# Patient Record
Sex: Male | Born: 1960
Health system: Southern US, Community
[De-identification: ages and names within clinical notes are randomized; demographics above are authoritative.]

## PROBLEM LIST (undated history)

## (undated) DIAGNOSIS — K2270 Barrett's esophagus without dysplasia: Secondary | ICD-10-CM

## (undated) DIAGNOSIS — E785 Hyperlipidemia, unspecified: Secondary | ICD-10-CM

## (undated) DIAGNOSIS — K219 Gastro-esophageal reflux disease without esophagitis: Secondary | ICD-10-CM

## (undated) HISTORY — DX: Gastro-esophageal reflux disease without esophagitis: K21.9

## (undated) HISTORY — PX: ESOPHAGOGASTRODUODENOSCOPY: SHX1529

## (undated) HISTORY — DX: Hyperlipidemia, unspecified: E78.5

## (undated) HISTORY — DX: Barrett's esophagus without dysplasia: K22.70

---

## 2015-03-30 ENCOUNTER — Encounter: Payer: Self-pay | Admitting: Gastroenterology

## 2018-02-12 DIAGNOSIS — J302 Other seasonal allergic rhinitis: Secondary | ICD-10-CM | POA: Diagnosis not present

## 2018-02-12 DIAGNOSIS — K2270 Barrett's esophagus without dysplasia: Secondary | ICD-10-CM | POA: Diagnosis not present

## 2018-02-12 DIAGNOSIS — E7800 Pure hypercholesterolemia, unspecified: Secondary | ICD-10-CM | POA: Diagnosis not present

## 2018-02-12 DIAGNOSIS — K219 Gastro-esophageal reflux disease without esophagitis: Secondary | ICD-10-CM | POA: Diagnosis not present

## 2018-02-12 DIAGNOSIS — Z23 Encounter for immunization: Secondary | ICD-10-CM | POA: Diagnosis not present

## 2018-02-12 DIAGNOSIS — Z125 Encounter for screening for malignant neoplasm of prostate: Secondary | ICD-10-CM | POA: Diagnosis not present

## 2018-02-12 DIAGNOSIS — R82998 Other abnormal findings in urine: Secondary | ICD-10-CM | POA: Diagnosis not present

## 2018-02-12 DIAGNOSIS — Z0000 Encounter for general adult medical examination without abnormal findings: Secondary | ICD-10-CM | POA: Diagnosis not present

## 2018-02-17 DIAGNOSIS — H2513 Age-related nuclear cataract, bilateral: Secondary | ICD-10-CM | POA: Diagnosis not present

## 2018-02-17 DIAGNOSIS — H33053 Total retinal detachment, bilateral: Secondary | ICD-10-CM | POA: Diagnosis not present

## 2018-02-17 DIAGNOSIS — H10413 Chronic giant papillary conjunctivitis, bilateral: Secondary | ICD-10-CM | POA: Diagnosis not present

## 2018-02-17 DIAGNOSIS — H35413 Lattice degeneration of retina, bilateral: Secondary | ICD-10-CM | POA: Diagnosis not present

## 2018-03-11 DIAGNOSIS — J392 Other diseases of pharynx: Secondary | ICD-10-CM | POA: Diagnosis not present

## 2018-03-11 DIAGNOSIS — K219 Gastro-esophageal reflux disease without esophagitis: Secondary | ICD-10-CM | POA: Diagnosis not present

## 2018-03-11 DIAGNOSIS — J301 Allergic rhinitis due to pollen: Secondary | ICD-10-CM | POA: Diagnosis not present

## 2018-03-18 ENCOUNTER — Encounter: Payer: Self-pay | Admitting: Gastroenterology

## 2018-04-08 DIAGNOSIS — J309 Allergic rhinitis, unspecified: Secondary | ICD-10-CM | POA: Insufficient documentation

## 2018-04-08 DIAGNOSIS — R062 Wheezing: Secondary | ICD-10-CM | POA: Diagnosis not present

## 2018-04-15 ENCOUNTER — Encounter (INDEPENDENT_AMBULATORY_CARE_PROVIDER_SITE_OTHER): Payer: Self-pay

## 2018-04-15 ENCOUNTER — Ambulatory Visit (INDEPENDENT_AMBULATORY_CARE_PROVIDER_SITE_OTHER): Payer: BLUE CROSS/BLUE SHIELD | Admitting: Gastroenterology

## 2018-04-15 ENCOUNTER — Encounter: Payer: Self-pay | Admitting: Gastroenterology

## 2018-04-15 VITALS — BP 128/84 | HR 72 | Ht 61.0 in | Wt 152.2 lb

## 2018-04-15 DIAGNOSIS — K219 Gastro-esophageal reflux disease without esophagitis: Secondary | ICD-10-CM

## 2018-04-15 DIAGNOSIS — K2270 Barrett's esophagus without dysplasia: Secondary | ICD-10-CM

## 2018-04-15 NOTE — Progress Notes (Addendum)
[facility] Consult Note:  History: Keith Pacheco 04/15/2018  Referring physician: Misty Stanley, MD [facility])  Reason for consult/chief complaint: Gastroesophageal Reflux (hx for years. c/o a minor burning sensation and dry throat. Worse at night. Denies dysphagia, abdominal pain. He does have some regurgitation.) And reported history of Barrett's esophagus  Subjective  HPI:   Keith Pacheco is a very pleasant man referred by a local ENT physician, who evaluated him on 03/11/2018 for chronic sinus congestion and throat irritation.  He had chronic allergic symptoms and also reported taking daily Nexium for a history of Barrett's esophagus. Rx with nasal sprays, referred to allergist.  The allergic symptoms began last year after he moved here from [place], and then got worse over the summer.  They have improved with the treatments, but he still has some dry scratchy throat, especially in the evening. He previously had intermittent heartburn or regurgitation, more so if he has offending foods like wine, chocolate or coffee.  Symptoms under good control with once daily Nexium, which he recently redosed to before meal on the recommendation of ENT. He denies dysphagia or odynophagia. Jhovanny brought records from his previous endoscopic testing in [place], good and color photographs.  No biopsy reports are available, so we have sent a record release request for those.  Upper endoscopy dated 03/30/2015 by Dr. Coralyn Helling reports irregular Z line at 40 cm, biopsies taken, remainder of exam normal except for reported laryngeal edema.  One color photos seems to show a small squamous island just above a mildly irregular Z line.  It is unknown if this is the exact spot where the biopsy was taken.  Repeat upper endoscopy November 2017 reportedly for Barrett's esophagus report a 1 cm length of changes consistent with Barrett's.Jaymon calls being told that no  Barrett's was found on those biopsies. Colonoscopy 03/21/2016 to the terminal ileum with good preparation was normal.  Repeat exam in 10 years was advised.  ROS:  Review of Systems  Constitutional: Negative for appetite change and unexpected weight change.  HENT: Negative for mouth sores and voice change.   Eyes: Negative for pain and redness.  Respiratory: Negative for cough and shortness of breath.   Cardiovascular: Negative for chest pain and palpitations.  Genitourinary: Negative for dysuria and hematuria.  Musculoskeletal: Negative for arthralgias and myalgias.  Skin: Negative for pallor and rash.  Neurological: Negative for weakness and headaches.  Hematological: Negative for adenopathy.     Past Medical History: Past Medical History:  Diagnosis Date  . Barrett esophagus   . GERD (gastroesophageal reflux disease)   . Hyperlipidemia      Past Surgical History: Past Surgical History:  Procedure Laterality Date  . ESOPHAGOGASTRODUODENOSCOPY       Family History: Family History  Problem Relation Age of Onset  . Esophageal cancer Mother   His mother died at age 84 from esophageal cancer (unknown type)  Social History: Social History   Socioeconomic History  . Marital status: Married    Spouse name: Not on file  . Number of children: 2  . Years of education: Not on file  . Highest education level: Not on file  Occupational History  . Occupation: CFO    Employer: [employer]  Social Needs  . Financial resource strain: Not on file  . Food insecurity:    Worry: Not on file    Inability: Not on file  . Transportation needs:    Medical: Not on  file    Non-medical: Not on file  Tobacco Use  . Smoking status: Never Smoker  . Smokeless tobacco: Never Used  Substance and Sexual Activity  . Alcohol use: Yes  . Drug use: Never  . Sexual activity: Not on file  Lifestyle  . Physical activity:    Days per week: Not on file    Minutes per session: Not on  file  . Stress: Not on file  Relationships  . Social connections:    Talks on phone: Not on file    Gets together: Not on file    Attends religious service: Not on file    Active member of club or organization: Not on file    Attends meetings of clubs or organizations: Not on file    Relationship status: Not on file  Other Topics Concern  . Not on file  Social History Narrative  . Not on file    Allergies: No Known Allergies  Outpatient Meds: Current Outpatient Medications  Medication Sig Dispense Refill  . aspirin 81 MG chewable tablet Chew by mouth.    Marland Kitchen. azelastine (ASTELIN) 0.1 % nasal spray Place into the nose.    . esomeprazole (NEXIUM) 40 MG capsule Take by mouth.    . fluticasone (FLONASE) 50 MCG/ACT nasal spray Place into the nose.    . simvastatin (ZOCOR) 20 MG tablet Take by mouth.     No current facility-administered medications for this visit.       ___________________________________________________________________ Objective   Exam:  BP 128/84   Pulse 72   Ht 5\' 1"  (1.549 m)   Wt 152 lb 4 oz (69.1 kg)   BMI 28.77 kg/m    General: this is a(n) well-appearing man with normal vocal quality  Eyes: sclera anicteric, no redness  ENT: oral mucosa moist without lesions, no cervical or supraclavicular lymphadenopathy  CV: RRR without murmur, S1/S2, no JVD, no peripheral edema  Resp: clear to auscultation bilaterally, normal RR and effort noted  GI: soft, no tenderness, with active bowel sounds. No guarding or palpable organomegaly noted.  Skin; warm and dry, no rash or jaundice noted  Neuro: awake, alert and oriented x 3. Normal gross motor function and fluent speech  Labs:  Records as noted above  Assessment: Encounter Diagnoses  Name Primary?  . Gastroesophageal reflux disease without esophagitis Yes  . Barrett's esophagus without dysplasia     Well-controlled reflux symptoms Family history of esophageal cancer. Personal history of  Barrett's esophagus (unknown if dysplasia), but apparently was not present on follow-up endoscopy a year later.  We discussed the current guidelines for Barrett's surveillance, and how it would usually be a 3-year upper endoscopy interval if no dysplasia is found.  Also, his family history of esophageal cancer was not known at that time.  Plan:  Obtain prior biopsy records by then determine appropriate interval for follow-up EGD.  I have told him how there are no clear guidelines on the optimal interval in this situation when Barrett's was not found on the follow-up exam.  However, given that it was reportedly seen in 2016 and he has a family history of esophageal cancer, it seems reasonable to look again at some point. Further advice to follow record review.  His GERD otherwise seems under good control, and his throat discomfort appears to be allergic /environmental.  Thank you for the courtesy of this consult.  Please call me with any questions or concerns.  Charlie PitterHenry L Danis III  CC: Tisovec, Richard  Lacretia Nicks, MD Misty Stanley, MD   Addendum for record review:  Upper endoscopy at [facility] ( Dr. Carver Fila) on 07/18/2010 Slightly irregular Z line biopsied, hiatal hernia, normal stomach and duodenum. Pathology of GE junction shows mild reactive carditis, squamous mucosa. Random esophagus biopsy shows mild reflux esophagitis, negative for Barrett's or eosinophilic esophagitis. Upper endoscopy 03/30/2015 irregular Z line as noted above.  Pathologic description is of squamous and junctional gastric mucosal "with focal Barrett's metaplasia showing no dysplasia".  Biopsy from member 2017 upper endoscopy is also of esophagogastric junction, squamous and gastric cardia mucosa.  The esophageal portion shows changes of reflux.  The cardia portion shows mild increase in lymphoplasmacytic infiltrate, no Helicobacter, no dysplasia or metaplasia.  Amada Jupiter, MD 05/07/2018

## 2018-04-15 NOTE — Patient Instructions (Signed)
If you are age 58 or older, your body mass index should be between 23-30. Your Body mass index is 28.77 kg/m. If this is out of the aforementioned range listed, please consider follow up with your Primary Care Provider.  If you are age 164 or younger, your body mass index should be between 19-25. Your Body mass index is 28.77 kg/m. If this is out of the aformentioned range listed, please consider follow up with your Primary Care Provider.   We will request your records from Dr Regino BellowErgun   It was a pleasure to see you today!  Dr. Myrtie Neitheranis

## 2018-05-01 ENCOUNTER — Telehealth: Payer: Self-pay | Admitting: Gastroenterology

## 2018-05-01 NOTE — Telephone Encounter (Signed)
GI records received for review pt saw Dr Myrtie Neitheranis on 11/27 placed on Dr Myrtie Neitheranis desk for review

## 2018-05-07 ENCOUNTER — Telehealth: Payer: Self-pay | Admitting: Gastroenterology

## 2018-05-07 NOTE — Telephone Encounter (Signed)
I reviewed the records that came from his GI physician in [place].  The Barrett's diagnosis from the biopsies in 2016 are questionable in my opinion.  There was definitely no evidence of Barrett's in 2017.  Because of his family history of esophageal cancer his personal history of reflux, I will put him on for recall upper endoscopy at a 5-year interval from the last exam, which would be November 2022.  These place recall. Last colonoscopy in November 2017 without polyps, so please place colonoscopy recall for November 2027 (it was not already done at recent visit).  See me in the meantime if needed.

## 2018-05-08 NOTE — Telephone Encounter (Signed)
Both recalls has been placed. Pt notified and aware.

## 2018-06-11 ENCOUNTER — Encounter: Payer: Self-pay | Admitting: Allergy & Immunology

## 2018-06-11 ENCOUNTER — Ambulatory Visit (INDEPENDENT_AMBULATORY_CARE_PROVIDER_SITE_OTHER): Payer: BLUE CROSS/BLUE SHIELD | Admitting: Allergy & Immunology

## 2018-06-11 VITALS — BP 122/82 | HR 51 | Temp 98.1°F | Resp 16 | Ht 66.0 in | Wt 153.0 lb

## 2018-06-11 DIAGNOSIS — J45990 Exercise induced bronchospasm: Secondary | ICD-10-CM | POA: Diagnosis not present

## 2018-06-11 DIAGNOSIS — J3089 Other allergic rhinitis: Secondary | ICD-10-CM | POA: Diagnosis not present

## 2018-06-11 DIAGNOSIS — J302 Other seasonal allergic rhinitis: Secondary | ICD-10-CM | POA: Diagnosis not present

## 2018-06-11 MED ORDER — FLUTICASONE PROPIONATE 93 MCG/ACT NA EXHU: 1.0000 | INHALANT_SUSPENSION | Freq: Every day | NASAL | 2 refills | Status: DC

## 2018-06-11 MED ORDER — ALBUTEROL SULFATE HFA 108 (90 BASE) MCG/ACT IN AERS: 2.0000 | INHALATION_SPRAY | Freq: Four times a day (QID) | RESPIRATORY_TRACT | 2 refills | Status: DC | PRN

## 2018-06-11 MED ORDER — MONTELUKAST SODIUM 10 MG PO TABS: 10.0000 mg | ORAL_TABLET | Freq: Every day | ORAL | 2 refills | Status: DC

## 2018-06-11 NOTE — Progress Notes (Signed)
NEW PATIENT  Date of Service/Encounter:  06/11/18  Referring provider: Gaspar Garbe, MD   Assessment:   Perennial and seasonal allergic rhinitis (trees, grasses, indoor molds, outdoor molds, dust mites, cat and cockroach)  Exercise-induced bronchospasm  Plan/Recommendations:   1. Exercise-induced bronchospasm - Lung testing looks great today. - All of your symptoms are consistent with exercise induced bronchospasm.  - I would recommend adding on Singulair (montelukast) 10mg  daily and using two puffs of albuterol 10-15 minutes before running.  - We could add on an inhaled steroid at some point if needed.   2. Chronic rhinitis - Testing today showed: trees, grasses, indoor molds, outdoor molds, dust mites, cat and cockroach - Copy of test results provided.  - Avoidance measures provided. - Stop taking: Flonase - Continue with: Astelin (azelastine) 2 sprays per nostril 1-2 times daily as needed - Start taking: Singulair (montelukast) 10mg  daily and Xhance (fluticasone) 1-2 sprays per nostril daily - You can use an extra dose of the antihistamine, if needed, for breakthrough symptoms.  - Consider nasal saline rinses 1-2 times daily to remove allergens from the nasal cavities as well as help with mucous clearance (this is especially helpful to do before the nasal sprays are given) - Consider allergy shots as a means of long-term control. - Allergy shots "re-train" and "reset" the immune system to ignore environmental allergens and decrease the resulting immune response to those allergens (sneezing, itchy watery eyes, runny nose, nasal congestion, etc).    - Allergy shots improve symptoms in 75-85% of patients.  - We can discuss more at the next appointment if the medications are not working for you.  3. Return in about 3 months (around 09/10/2018).  Subjective:   Keith Pacheco is a 58 y.o. male presenting today for evaluation of  Chief Complaint  Patient presents with  .  Allergies  . Eczema  . Wheezing    Keith Pacheco has a history of the following: Patient Active Problem List   Diagnosis Date Noted  . Allergic rhinitis 04/08/2018    History obtained from: chart review and patient.  [name] was referred by Gaspar Garbe, MD.     Keith Pacheco is a 58 y.o. male presenting for an evaluation of shortness of breath and rhinitis.  He works for [employer]. He moved here for [facility]. He grew up in [place] weather [place] He moved to the [place] around 35 years ago.   Asthma/Respiratory Symptom History: He reports that he has breating problems when he runs in the morning. He reports that he wheezes when he runs, especially when it is cold outisde. He did have an albuterol inhaler at some point.   Allergic Rhinitis Symptom History: He did have allergies for many years. He lived in [place] at one point. He did have allergies in [place] for one week per year. [place] was year long and light. Then he moved here and was fairly good for one year. He had intense sinus pressure that is worse in the fall. This was worse the second year. He saw ENT (Dr. Sherlon Handing at [facility]) who felt that this was allergies. He does have some dry mouth and ubnring in his throat. He will use allegra occasionally and sometimes every day. It does not do "that much" for him. Zyrtec made him drowsy. He does use He was started on Flonase and Astelin. This combination does something and it released his sinus pressure.   He does have  eczema on his bilateral upper arms as well as his chest. He uses triamcinolone ointment or something similar to help with the itching. It tends ot get worse when it is cold and dry.   Otherwise, there is no history of other atopic diseases, including food allergies, drug allergies, stinging insect allergies, eczema or urticaria. There is no significant infectious history. Vaccinations are up to date.    Past Medical History: Patient Active  Problem List   Diagnosis Date Noted  . Allergic rhinitis 04/08/2018    Medication List:  Allergies as of 06/11/2018   No Known Allergies     Medication List       Accurate as of June 11, 2018  6:40 PM. Always use your most recent med list.        albuterol 108 (90 Base) MCG/ACT inhaler Commonly known as:  PROVENTIL HFA;VENTOLIN HFA Inhale 2 puffs into the lungs every 6 (six) hours as needed for wheezing or shortness of breath.   aspirin 81 MG chewable tablet Chew by mouth.   azelastine 0.1 % nasal spray Commonly known as:  ASTELIN Place into the nose.   esomeprazole 40 MG capsule Commonly known as:  NEXIUM Take by mouth.   Fluticasone Propionate 93 MCG/ACT Exhu Commonly known as:  XHANCE Place 1-2 sprays into the nose daily.   montelukast 10 MG tablet Commonly known as:  SINGULAIR Take 1 tablet (10 mg total) by mouth at bedtime.   simvastatin 20 MG tablet Commonly known as:  ZOCOR Take by mouth.       Birth History: non-contributory  Developmental History: non-contributory.   Past Surgical History: Past Surgical History:  Procedure Laterality Date  . ESOPHAGOGASTRODUODENOSCOPY       Family History: Family History  Problem Relation Age of Onset  . Esophageal cancer Mother      Social History: Plato lives at home with his wife.  He has 2 children.  1 is graduated from college with a degree in finance.  Another 1 is in college in [place] and has not figured out what he wants to do with his life.  In any case, he and his wife live in a 366 year old house.  They have wood in the main living areas and carpeting in the bedrooms.  They have a combination of gas and electric heating with central cooling.  There are no animals inside or outside of the home.  There are no dust mite covers on the bedding.  There is no tobacco exposure.  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     Review of Systems: a 14-point review of systems is pertinent for what is mentioned in HPI.  Otherwise, all other systems were negative. Constitutional: negative other than that listed in the HPI Eyes: negative other than that listed in the HPI Ears, nose, mouth, throat, and face: negative other than that listed in the HPI Respiratory: negative other than that listed in the HPI Cardiovascular: negative other than that listed in the HPI Gastrointestinal: negative other than that listed in the HPI Genitourinary: negative other than that listed in the HPI Integument: negative other than that listed in the HPI Hematologic: negative other than that listed in the HPI Musculoskeletal: negative other than that listed in the HPI Neurological: negative other than that listed in the HPI Allergy/Immunologic: negative other than that listed in the HPI  Objective:   Blood pressure 122/82, pulse (!) 51, temperature 98.1 F (36.7 C), temperature source Oral, resp. rate 16, height 5\' 6"  (1.676 m), weight 153 lb (69.4 kg), SpO2 99 %. Body mass index is 24.69 kg/m.   Physical Exam:  General: Alert, interactive, in no acute distress. Very talkative and pleasant.  Eyes: No conjunctival injection bilaterally, no discharge on the right, no discharge on the left and no Horner-Trantas dots present. PERRL bilaterally. EOMI without pain. No photophobia.  Ears: Right TM pearly gray with normal light reflex, Left TM pearly gray with normal light reflex, Right TM intact without perforation and Left TM intact without perforation.  Nose/Throat: External nose within normal limits and septum midline. Turbinates edematous and pale with clear discharge. Posterior oropharynx erythematous with cobblestoning in the posterior oropharynx. Tonsils 2+ without exudates.  Tongue without thrush. Neck: Supple without thyromegaly. Trachea midline. Adenopathy: no enlarged lymph nodes appreciated in the anterior  cervical, occipital, axillary, epitrochlear, inguinal, or popliteal regions. Lungs: Clear to auscultation without wheezing, rhonchi or rales. No increased work of breathing. CV: Normal S1/S2. No murmurs. Capillary refill <2 seconds.  Abdomen: Nondistended, nontender. No guarding or rebound tenderness. Bowel sounds present in all fields and hypoactive  Skin: Warm and dry, without lesions or rashes. Extremities:  No clubbing, cyanosis or edema. Neuro:   Grossly intact. No focal deficits appreciated. Responsive to questions.  Diagnostic studies:   Spirometry: results normal (FEV1: 3.58/112%, FVC: 4.27/102%, FEV1/FVC: 84%).    Spirometry consistent with normal pattern.   Allergy Studies:   Airborne Adult Perc - 06/11/18 1541    Time Antigen Placed  1540    Allergen Manufacturer  Waynette ButteryGreer    Location  Back    Number of Test  59    Panel 1  Select    1. Control-Buffer 50% Glycerol  Negative    2. Control-Histamine 1 mg/ml  2+    3. Albumin saline  Negative    4. Bahia  Negative    5. French Southern Territ[place]ermuda  3+    6. Johnson  --   +/-   7. Kentucky Blue  Negative    8. Meadow Fescue  Negative    9. Perennial Rye  Negative    10. Sweet Vernal  Negative    11. Timothy  Negative    12. Cocklebur  Negative    13. Burweed Marshelder  Negative    14. Ragweed, short  Negative    15. Ragweed, Giant  Negative    16. Plantain,  English  Negative    17. Lamb's Quarters  Negative    18. Sheep Sorrell  Negative    19. Rough Pigweed  Negative    20. Marsh Elder, Rough  Negative    21. Mugwort, Common  Negative    22. Ash mix  Negative    23. Birch mix  3+    24. Beech American  2+    25. Box, Elder  2+    26. Cedar, red  Negative    27. Cottonwood, Eastern  2+    28. Elm mix  2+    29. Hickory mix  2+    30. Maple mix  Negative    31. Oak, Guinea-BissauEastern mix  3+    32. Pecan Pollen  2+    33. Pine mix  Negative    34. Sycamore Eastern  2+    35. Walnut, Black Pollen  2+    36. Alternaria alternata   Negative  37. Cladosporium Herbarum  Negative    38. Aspergillus mix  Negative    39. Penicillium mix  Negative    40. Bipolaris sorokiniana (Helminthosporium)  Negative    41. Drechslera spicifera (Curvularia)  Negative    42. Mucor plumbeus  Negative    43. Fusarium moniliforme  Negative    44. Aureobasidium pullulans (pullulara)  Negative    45. Rhizopus oryzae  Negative    46. Botrytis cinera  Negative    47. Epicoccum nigrum  Negative    48. Phoma betae  Negative    49. Candida Albicans  Negative    50. Trichophyton mentagrophytes  Negative    51. Mite, D Farinae  5,000 AU/ml  3+    52. Mite, D Pteronyssinus  5,000 AU/ml  3+    53. Cat Hair 10,000 BAU/ml  3+    54.  Dog Epithelia  Negative    55. Mixed Feathers  Negative    56. Horse Epithelia  Negative    57. Cockroach, German  Negative    58. Mouse  Negative    59. Tobacco Leaf  Negative     Intradermal - 06/11/18 1627    Time Antigen Placed  1620    Allergen Manufacturer  Waynette Buttery    Location  Arm    Number of Test  12    Intradermal  Select    Control  Negative    French Southern Territ[place]  3+    Johnson  --   +/-   7 Grass  2+    Ragweed mix  Negative    Weed mix  Negative    Mold 1  Negative    Mold 2  3+    Mold 3  2+    Mold 4  2+    Dog  Negative    Cockroach  2+        Allergy testing results were read and interpreted by myself, documented by clinical staff.       Malachi Bonds, MD [facility]

## 2018-06-11 NOTE — Patient Instructions (Addendum)
1. Exercise-induced bronchospasm - Lung testing looks great today. - All of your symptoms are consistent with exercise induced bronchospasm.  - I would recommend adding on Singulair (montelukast) 10mg  daily and using two puffs of albuterol 10-15 minutes before running.  - We could add on an inhaled steroid at some point if needed.   2. Chronic rhinitis - Testing today showed: trees, grasses, indoor molds, outdoor molds, dust mites, cat and cockroach - Copy of test results provided.  - Avoidance measures provided. - Stop taking: Flonase - Continue with: Astelin (azelastine) 2 sprays per nostril 1-2 times daily as needed - Start taking: Singulair (montelukast) 10mg  daily and Xhance (fluticasone) 1-2 sprays per nostril daily - You can use an extra dose of the antihistamine, if needed, for breakthrough symptoms.  - Consider nasal saline rinses 1-2 times daily to remove allergens from the nasal cavities as well as help with mucous clearance (this is especially helpful to do before the nasal sprays are given) - Consider allergy shots as a means of long-term control. - Allergy shots "re-train" and "reset" the immune system to ignore environmental allergens and decrease the resulting immune response to those allergens (sneezing, itchy watery eyes, runny nose, nasal congestion, etc).    - Allergy shots improve symptoms in 75-85% of patients.  - We can discuss more at the next appointment if the medications are not working for you.  3. Return in about 3 months (around 09/10/2018).   Please inform us of any Emergency Department visits, hospitalizations, or changes in symptoms. Call us before going to the ED for breathing or allergy symptoms since we might be able to fit you in for a sick visit. Feel free to contact us anytime with any questions, problems, or concerns.  It was a pleasure to meet you today!  Websites that have reliable patient information: 1. American Academy of Asthma, Allergy, and  Immunology: www.aaaai.org 2. Food Allergy Research and Education (FARE): foodallergy.org 3. Mothers of Asthmatics: http://www.asthmacommunitynetwork.org 4. American College of Allergy, Asthma, and Immunology: MissingWeapons.cawww.acaai.org   Make sure you are registered to vote! If you have moved or changed any of your contact information, you will need to get this updated before voting!    Voter ID laws are going into effect for the General Election in November 2020! Be prepared! Check out LandscapingDigest.dkhttps://www.ncsbe.gov/voter-ID for more details.       Reducing Pollen Exposure  The American Academy of Allergy, Asthma and Immunology suggests the following steps to reduce your exposure to pollen during allergy seasons.    1. Do not hang sheets or clothing out to dry; pollen may collect on these items. 2. Do not mow lawns or spend time around freshly cut grass; mowing stirs up pollen. 3. Keep windows closed at night.  Keep car windows closed while driving. 4. Minimize morning activities outdoors, a time when pollen counts are usually at their highest. 5. Stay indoors as much as possible when pollen counts or humidity is high and on windy days when pollen tends to remain in the air longer. 6. Use air conditioning when possible.  Many air conditioners have filters that trap the pollen spores. 7. Use a HEPA room air filter to remove pollen form the indoor air you breathe.  Control of Mold Allergen   Mold and fungi can grow on a variety of surfaces provided certain temperature and moisture conditions exist.  Outdoor molds grow on plants, decaying vegetation and soil.  The major outdoor mold, Alternaria and Cladosporium, are  found in very high numbers during hot and dry conditions.  Generally, a late Summer - Fall peak is seen for common outdoor fungal spores.  Rain will temporarily lower outdoor mold spore count, but counts rise rapidly when the rainy period ends.  The most important indoor molds are Aspergillus and  Penicillium.  Dark, humid and poorly ventilated basements are ideal sites for mold growth.  The next most common sites of mold growth are the bathroom and the kitchen.  Outdoor (Seasonal) Mold Control  Positive outdoor molds via skin testing: Bipolaris (Helminthsporium), Drechslera (Curvalaria) and Mucor  1. Use air conditioning and keep windows closed 2. Avoid exposure to decaying vegetation. 3. Avoid leaf raking. 4. Avoid grain handling. 5. Consider wearing a face mask if working in moldy areas.  6.   Indoor (Perennial) Mold Control   Positive indoor molds via skin testing: Aspergillus, Penicillium, Fusarium, Aureobasidium (Pullulara) and Rhizopus  1. Maintain humidity below 50%. 2. Clean washable surfaces with 5% bleach solution. 3. Remove sources e.g. contaminated carpets.     Control of Dog or Cat Allergen  Avoidance is the best way to manage a dog or cat allergy. If you have a dog or cat and are allergic to dog or cats, consider removing the dog or cat from the home. If you have a dog or cat but don't want to find it a new home, or if your family wants a pet even though someone in the household is allergic, here are some strategies that may help keep symptoms at bay:  1. Keep the pet out of your bedroom and restrict it to only a few rooms. Be advised that keeping the dog or cat in only one room will not limit the allergens to that room. 2. Don't pet, hug or kiss the dog or cat; if you do, wash your hands with soap and water. 3. High-efficiency particulate air (HEPA) cleaners run continuously in a bedroom or living room can reduce allergen levels over time. 4. Regular use of a high-efficiency vacuum cleaner or a central vacuum can reduce allergen levels. 5. Giving your dog or cat a bath at least once a week can reduce airborne allergen.    Control of Cockroach Allergen  Cockroach allergen has been identified as an important cause of acute attacks of asthma, especially in  urban settings.  There are fifty-five species of cockroach that exist in the [place], however only three, the [place], [place] species produce allergen that can affect patients with Asthma.  Allergens can be obtained from fecal particles, egg casings and secretions from cockroaches.    1. Remove food sources. 2. Reduce access to water. 3. Seal access and entry points. 4. Spray runways with 0.5-1% Diazinon or Chlorpyrifos 5. Blow boric acid power under stoves and refrigerator. 6. Place bait stations (hydramethylnon) at feeding sites.

## 2018-08-05 ENCOUNTER — Other Ambulatory Visit: Payer: Self-pay | Admitting: *Deleted

## 2018-08-05 MED ORDER — MONTELUKAST SODIUM 10 MG PO TABS: 10.0000 mg | ORAL_TABLET | Freq: Every day | ORAL | 0 refills | Status: AC

## 2018-08-05 MED ORDER — ALBUTEROL SULFATE HFA 108 (90 BASE) MCG/ACT IN AERS: INHALATION_SPRAY | RESPIRATORY_TRACT | 0 refills | Status: AC

## 2018-09-15 ENCOUNTER — Ambulatory Visit: Payer: BLUE CROSS/BLUE SHIELD | Admitting: Allergy & Immunology

## 2018-09-16 DIAGNOSIS — Z20818 Contact with and (suspected) exposure to other bacterial communicable diseases: Secondary | ICD-10-CM | POA: Diagnosis not present

## 2018-09-16 DIAGNOSIS — J181 Lobar pneumonia, unspecified organism: Secondary | ICD-10-CM | POA: Diagnosis not present

## 2018-09-16 DIAGNOSIS — R05 Cough: Secondary | ICD-10-CM | POA: Diagnosis not present

## 2018-09-18 ENCOUNTER — Ambulatory Visit (INDEPENDENT_AMBULATORY_CARE_PROVIDER_SITE_OTHER): Payer: BLUE CROSS/BLUE SHIELD | Admitting: Allergy & Immunology

## 2018-09-18 ENCOUNTER — Encounter: Payer: Self-pay | Admitting: Allergy & Immunology

## 2018-09-18 DIAGNOSIS — J3089 Other allergic rhinitis: Secondary | ICD-10-CM

## 2018-09-18 DIAGNOSIS — J302 Other seasonal allergic rhinitis: Secondary | ICD-10-CM | POA: Diagnosis not present

## 2018-09-18 DIAGNOSIS — J45990 Exercise induced bronchospasm: Secondary | ICD-10-CM

## 2018-09-18 NOTE — Progress Notes (Signed)
RE: Keith Pacheco MRN: [id number] DOB: 31-Jul-1960 Date of Telemedicine Visit: 09/18/2018  Referring provider: Gaspar Garbeisovec, Richard W, MD Primary care provider: Gaspar Garbeisovec, Richard W, MD  Chief Complaint: Allergic Rhinitis  (has been tested for covid, pneumonia)   Telemedicine Follow Up Visit via Telephone: I connected with Keith Coffeeded Schmit for a follow up on 09/18/18 by telephone and verified that I am speaking with the correct person using two identifiers.   I discussed the limitations, risks, security and privacy concerns of performing an evaluation and management service by telephone and the availability of in person appointments. I also discussed with the patient that there may be a patient responsible charge related to this service. The patient expressed understanding and agreed to proceed.  Patient is at home accompanied by his wife who provided/contributed to the history.  Provider is at the office.  Visit start time: 11:00 AM Visit end time: 11:25 AM [facility] and medical assistant/nurse: Ladona Ridgelaylor  History of Present Illness:  He is a 58 y.o. male, who is being followed for allergic rhinitis and exercise induced bronchospasm. His previous allergy office visit was in January 2020 with Dr. Dellis AnesGallagher. At the last visit, he had testing that was positive to trees, grasses, indoor and outdoor molds, dust mites, cat, and cockroach. We stopped his fluticasone and started Xhance instead. We continued him on azelastine nasal spray and added on Singulair 10mg  daily. For his exercise induced bronchospasm, we added on the Singulair and recommended adding on albuterol 10-15 minutes prior to physical activity.   Since the last visit, he has mostly done well. He does tell me that his symptoms have been out of control during the spring season. He did add on Allegra to his regimen and he stopped taking the [place] since he heard that steroids and COVID-19 do not go well  together. He did not stay on the azelastine since he was under the impression that the Xhance replaced the azelastine. He has not needed any prednisone bursts but he has been using some eye drops he found in his medicine cabinet, including Bepreve and [place].   He also was recently diagnosed with pneumonia via a CXR. He was very fatigued and actually spent 48 hours in bed. He did have a fever. COVID-19 swab was negative and he was started on levofloxacin. He is now feeling better although his energy is not back to his baseline.  He is not interested in allergen immunotherapy and overall he is trying to decrease his exposure to medicines in general.   Otherwise, there have been no changes to his past medical history, surgical history, family history, or social history.  Assessment and Plan:  [name] is a 58 y.o. male with:  Exercise-induced bronchospasm  Seasonal and perennial allergic rhinitis (trees, grasses, indoor molds, outdoor molds, dust mites, cat and cockroach)   1. Exercise-induced bronchospasm - Lung testing looks great today. - All of your symptoms are consistent with exercise induced bronchospasm.  - I would recommend adding on Singulair (montelukast) 10mg  daily and using two puffs of albuterol 10-15 minutes before running.  - We could add on an inhaled steroid at some point if needed.   2. Chronic rhinitis (trees, grasses, indoor molds, outdoor molds, dust mites, cat and cockroach) - Continue with: Allegra (fexofenadine) 180mg  table once daily, Singulair (montelukast) 10mg  daily, Xhance (fluticasone) 1-2 sprays per nostril twice daily and Astelin (azelastine) 2 sprays per nostril 1-2 times daily as needed - Consider allergen immunotherapy  in the future.   3. Return in about 1 year (around 09/18/2019). This can be an in-person, a virtual Webex or a telephone follow up visit.   Diagnostics: None.  Medication List:  Current Outpatient Medications  Medication Sig Dispense  Refill  . aspirin 81 MG chewable tablet Chew by mouth.    . fexofenadine (ALLEGRA) 180 MG tablet Take 180 mg by mouth daily.    Marland Kitchen levofloxacin (LEVAQUIN) 500 MG tablet TK 1 T PO QD FOR 7 DAYS    . montelukast (SINGULAIR) 10 MG tablet Take 1 tablet (10 mg total) by mouth at bedtime. 90 tablet 0  . simvastatin (ZOCOR) 10 MG tablet     . albuterol (PROVENTIL HFA;VENTOLIN HFA) 108 (90 Base) MCG/ACT inhaler Inhale two puffs every 4-6 hours if needed for cough or wheeze. (Patient not taking: Reported on 09/18/2018) 3 Inhaler 0  . azelastine (ASTELIN) 0.1 % nasal spray Place into the nose.    . esomeprazole (NEXIUM) 40 MG capsule Take by mouth.    . Fluticasone Propionate (XHANCE) 93 MCG/ACT EXHU Place 1-2 sprays into the nose daily. (Patient not taking: Reported on 09/18/2018) 16 mL 2   No current facility-administered medications for this visit.    Allergies: No Known Allergies I reviewed his past medical history, social history, family history, and environmental history and no significant changes have been reported from previous visits.  Review of Systems  Constitutional: Negative for chills, diaphoresis, fatigue and fever.  HENT: Negative for congestion, ear discharge, ear pain, facial swelling, postnasal drip, rhinorrhea, sinus pressure, sinus pain and sore throat.   Eyes: Negative for pain, discharge and itching.  Respiratory: Negative for apnea, cough, chest tightness and shortness of breath.   Cardiovascular: Negative for chest pain.  Gastrointestinal: Negative for diarrhea and nausea.  Musculoskeletal: Negative for arthralgias and myalgias.  Skin: Negative for rash.  Allergic/Immunologic: Positive for environmental allergies. Negative for food allergies.    Objective:  Physical exam not obtained as encounter was done via telephone.   Previous notes and tests were reviewed.  I discussed the assessment and treatment plan with the patient. The patient was provided an opportunity to ask  questions and all were answered. The patient agreed with the plan and demonstrated an understanding of the instructions.   The patient was advised to call back or seek an in-person evaluation if the symptoms worsen or if the condition fails to improve as anticipated.  I provided 25 minutes of non-face-to-face time during this encounter.  It was my pleasure to participate in [place] care today. Please feel free to contact me with any questions or concerns.   Sincerely,  Alfonse Spruce, MD

## 2018-09-18 NOTE — Patient Instructions (Addendum)
1. Exercise-induced bronchospasm - Lung testing looks great today. - All of your symptoms are consistent with exercise induced bronchospasm.  - I would recommend adding on Singulair (montelukast) 10mg  daily and using two puffs of albuterol 10-15 minutes before running.  - We could add on an inhaled steroid at some point if needed.   2. Chronic rhinitis (trees, grasses, indoor molds, outdoor molds, dust mites, cat and cockroach) - Continue with: Allegra (fexofenadine) 180mg  table once daily, Singulair (montelukast) 10mg  daily, Xhance (fluticasone) 1-2 sprays per nostril twice daily and Astelin (azelastine) 2 sprays per nostril 1-2 times daily as needed - Consider allergen immunotherapy in the future.   3. Return in about 1 year (around 09/18/2019). This can be an in-person, a virtual Webex or a telephone follow up visit.   Please inform us of any Emergency Department visits, hospitalizations, or changes in symptoms. Call us before going to the ED for breathing or allergy symptoms since we might be able to fit you in for a sick visit. Feel free to contact us anytime with any questions, problems, or concerns.  It was a pleasure to talk to you today today!  Websites that have reliable patient information: 1. American Academy of Asthma, Allergy, and Immunology: www.aaaai.org 2. Food Allergy Research and Education (FARE): foodallergy.org 3. Mothers of Asthmatics: http://www.asthmacommunitynetwork.org 4. American College of Allergy, Asthma, and Immunology: www.acaai.org  "Like" [place] on Facebook and Instagram for our latest updates!      Make sure you are registered to vote! If you have moved or changed any of your contact information, you will need to get this updated before voting!    Voter ID laws are NOT going into effect for the General Election in November 2020! DO NOT let this stop you from exercising your right to vote!

## 2019-02-10 DIAGNOSIS — Z125 Encounter for screening for malignant neoplasm of prostate: Secondary | ICD-10-CM | POA: Diagnosis not present

## 2019-02-10 DIAGNOSIS — E7800 Pure hypercholesterolemia, unspecified: Secondary | ICD-10-CM | POA: Diagnosis not present

## 2019-02-10 DIAGNOSIS — Z0000 Encounter for general adult medical examination without abnormal findings: Secondary | ICD-10-CM | POA: Diagnosis not present

## 2019-02-17 DIAGNOSIS — E7800 Pure hypercholesterolemia, unspecified: Secondary | ICD-10-CM | POA: Diagnosis not present

## 2019-02-17 DIAGNOSIS — K2270 Barrett's esophagus without dysplasia: Secondary | ICD-10-CM | POA: Diagnosis not present

## 2019-02-17 DIAGNOSIS — J302 Other seasonal allergic rhinitis: Secondary | ICD-10-CM | POA: Diagnosis not present

## 2019-02-17 DIAGNOSIS — K219 Gastro-esophageal reflux disease without esophagitis: Secondary | ICD-10-CM | POA: Diagnosis not present

## 2019-02-17 DIAGNOSIS — Z1331 Encounter for screening for depression: Secondary | ICD-10-CM | POA: Diagnosis not present

## 2019-02-17 DIAGNOSIS — Z0000 Encounter for general adult medical examination without abnormal findings: Secondary | ICD-10-CM | POA: Diagnosis not present

## 2019-03-04 DIAGNOSIS — Z23 Encounter for immunization: Secondary | ICD-10-CM | POA: Diagnosis not present

## 2019-03-09 ENCOUNTER — Encounter: Payer: Self-pay | Admitting: Gastroenterology

## 2019-03-09 ENCOUNTER — Ambulatory Visit (INDEPENDENT_AMBULATORY_CARE_PROVIDER_SITE_OTHER): Payer: BC Managed Care – PPO | Admitting: Gastroenterology

## 2019-03-09 VITALS — BP 124/80 | HR 64 | Temp 97.8°F | Ht 66.0 in | Wt 150.0 lb

## 2019-03-09 DIAGNOSIS — R140 Abdominal distension (gaseous): Secondary | ICD-10-CM | POA: Insufficient documentation

## 2019-03-09 DIAGNOSIS — R109 Unspecified abdominal pain: Secondary | ICD-10-CM

## 2019-03-09 DIAGNOSIS — R1013 Epigastric pain: Secondary | ICD-10-CM | POA: Insufficient documentation

## 2019-03-09 DIAGNOSIS — R1033 Periumbilical pain: Secondary | ICD-10-CM | POA: Diagnosis not present

## 2019-03-09 NOTE — Patient Instructions (Signed)
You have been scheduled for a CT scan of the abdomen and pelvis at [facility] ([address]---this is in the same building as [employer]).   You are scheduled on 03/26/2019 at 2:30pm. You should arrive 15 minutes prior to your appointment time for registration. Please follow the written instructions below on the day of your exam:  WARNING: IF YOU ARE ALLERGIC TO IODINE/X-RAY DYE, PLEASE NOTIFY RADIOLOGY IMMEDIATELY AT [phone]! YOU WILL BE GIVEN A 13 HOUR PREMEDICATION PREP.  1) Do not eat anything after 10:30am (4 hours prior to your test) 2) You have been given 2 bottles of oral contrast to drink. The solution may taste better if refrigerated, but do NOT add ice or any other liquid to this solution. Shake well before drinking.    Drink 1 bottle of contrast @ 12:30pm (2 hours prior to your exam)  Drink 1 bottle of contrast @ 1:30pm (1 hour prior to your exam)  You may take any medications as prescribed with a small amount of water, if necessary. If you take any of the following medications: METFORMIN, GLUCOPHAGE, GLUCOVANCE, AVANDAMET, RIOMET, FORTAMET, [place] MET, JANUMET, GLUMETZA or METAGLIP, you MAY be asked to HOLD this medication 48 hours AFTER the exam.  The purpose of you drinking the oral contrast is to aid in the visualization of your intestinal tract. The contrast solution may cause some diarrhea. Depending on your individual set of symptoms, you may also receive an intravenous injection of x-ray contrast/dye. Plan on being at [facility] for 30 minutes or longer, depending on the type of exam you are having performed.  This test typically takes 30-45 minutes to complete.  If you have any questions regarding your exam or if you need to reschedule, you may call the CT department at [phone] between the hours of 8:00 am and 5:00 pm, Monday-Friday.  ________________________________________________________________________

## 2019-03-09 NOTE — Progress Notes (Signed)
03/09/2019 Keith Pacheco [id number] [birth year]   HISTORY OF PRESENT ILLNESS: This is a 58 year old male who is a patient of Dr. Irving Burton.  He was last seen in November 2019.  He is here today with complaints of epigastric/periumbilical/mid abdominal pain that has been present for the past few months.  He is says it has been consistent on a daily basis.  He says that it feels like a tightness and when he lays on his stomach he feels like there is something there.  It sometimes goes over underneath the left rib cage.  He complains of feeling bloated and very gassy when he eats.  He says that he has a lot of discomfort after he eats and gurgling in his stomach.  He says that he is not been eating as much because he becomes uncomfortable with his symptoms after eating.  He feels like his reflux is well controlled on his Nexium daily.  He tells me he takes a probiotic daily for years.  He has had no major change in his diet to account for symptoms.  He denies any change in bowel habits.  He says his bowels move well without any blood.  He also says that he tries to exercise regularly and when he exerts himself and exercises his whole abdomen feels sore.  He denies nausea or vomiting.  He had basic labs performed by his PCP including a CBC and CMP, which were unremarkable.  Those labs are being sent for scanning.  Previous GI studies:  "Upper endoscopy dated 04/12/2015 by Dr. Coralyn Helling reports irregular Z line at 40 cm, biopsies taken, remainder of exam normal except for reported laryngeal edema.  One color photos seems to show a small squamous island just above a mildly irregular Z line.  It is unknown if this is the exact spot where the biopsy was taken.  Repeat upper endoscopy November 2017 reportedly for Barrett's esophagus report a 1 cm length of changes consistent with Barrett's.Keith Pacheco recalls being told that no Barrett's was found on those biopsies. Colonoscopy 03/21/2016 to the terminal ileum with  good preparation was normal.  Repeat exam in 10 years was advised."   Past Medical History:  Diagnosis Date  . Barrett esophagus   . GERD (gastroesophageal reflux disease)   . Hyperlipidemia    Past Surgical History:  Procedure Laterality Date  . ESOPHAGOGASTRODUODENOSCOPY      reports that he has never smoked. He has never used smokeless tobacco. He reports current alcohol use. He reports that he does not use drugs. family history includes Esophageal cancer in his mother. No Known Allergies    Outpatient Encounter Medications as of 03/09/2019  Medication Sig  . albuterol (PROVENTIL HFA;VENTOLIN HFA) 108 (90 Base) MCG/ACT inhaler Inhale two puffs every 4-6 hours if needed for cough or wheeze.  Marland Kitchen aspirin 81 MG chewable tablet Chew by mouth.  . esomeprazole (NEXIUM) 40 MG capsule Take by mouth.  . montelukast (SINGULAIR) 10 MG tablet Take 1 tablet (10 mg total) by mouth at bedtime.  . simvastatin (ZOCOR) 10 MG tablet   . [DISCONTINUED] azelastine (ASTELIN) 0.1 % nasal spray Place into the nose.  . [DISCONTINUED] fexofenadine (ALLEGRA) 180 MG tablet Take 180 mg by mouth daily.  . [DISCONTINUED] Fluticasone Propionate (XHANCE) 93 MCG/ACT EXHU Place 1-2 sprays into the nose daily. (Patient not taking: Reported on 09/18/2018)  . [DISCONTINUED] levofloxacin (LEVAQUIN) 500 MG tablet TK 1 T PO QD FOR 7 DAYS   No facility-administered  encounter medications on file as of 03/09/2019.      REVIEW OF SYSTEMS  : All other systems reviewed and negative except where noted in the History of Present Illness.   PHYSICAL EXAM: BP 124/80   Pulse 64   Temp 97.8 F (36.6 C)   Ht 5\' 6"  (1.676 m)   Wt 150 lb (68 kg)   BMI 24.21 kg/m  General: Well developed white male in no acute distress Head: Normocephalic and atraumatic Eyes:  Sclerae anicteric, conjunctiva pink. Ears: Normal auditory acuity Lungs: Clear throughout to auscultation; no increased WOB. Heart: Regular rate and rhythm; no M/R/G.  Abdomen: Soft, non-distended.  BS present.  Mild epigastric/peri-umbilical TTP. Musculoskeletal: Symmetrical with no gross deformities  Skin: No lesions on visible extremities Extremities: No edema  Neurological: Alert oriented x 4, grossly non-focal Psychological:  Alert and cooperative. Normal mood and affect  ASSESSMENT AND PLAN: 58 year old male with complaints of epigastric/periumbilical/mid abdominal pain with associated bloating and decrease in appetite with a lot of noise/borborygmi.  Symptoms have been present consistently on a daily basis for the past 2 or 3 months and have been quite bothersome, affecting his oral intake to a degree.  He is up-to-date with EGD and colonoscopy.  We will plan for CT scan of the abdomen pelvis with contrast.  He was also given samples of IBgard to try in the interim.   CC:  Tisovec, Fransico Him, MD

## 2019-03-11 NOTE — Progress Notes (Signed)
____________________________________________________________  Attending physician addendum:  Thank you for sending this case to me. I have reviewed the entire note, and the outlined plan seems appropriate.  Briany Aye Danis, MD  ____________________________________________________________  

## 2019-03-26 ENCOUNTER — Ambulatory Visit (INDEPENDENT_AMBULATORY_CARE_PROVIDER_SITE_OTHER)
Admission: RE | Admit: 2019-03-26 | Discharge: 2019-03-26 | Disposition: A | Discharge status: Home / Self Care | Payer: BC Managed Care – PPO | Source: Ambulatory Visit | Attending: Gastroenterology | Admitting: Gastroenterology

## 2019-03-26 ENCOUNTER — Other Ambulatory Visit: Payer: Self-pay

## 2019-03-26 DIAGNOSIS — R109 Unspecified abdominal pain: Secondary | ICD-10-CM | POA: Diagnosis not present

## 2019-03-26 DIAGNOSIS — R140 Abdominal distension (gaseous): Secondary | ICD-10-CM

## 2019-03-26 DIAGNOSIS — R1033 Periumbilical pain: Secondary | ICD-10-CM

## 2019-03-26 DIAGNOSIS — R1013 Epigastric pain: Secondary | ICD-10-CM

## 2019-03-26 MED ORDER — IOHEXOL 300 MG/ML  SOLN
100.0000 mL | Freq: Once | INTRAMUSCULAR | Status: AC | PRN
  Administered 2019-03-26: 100 mL via INTRAVENOUS

## 2019-04-26 DIAGNOSIS — M545 Low back pain: Secondary | ICD-10-CM | POA: Diagnosis not present

## 2019-05-11 DIAGNOSIS — M545 Low back pain: Secondary | ICD-10-CM | POA: Diagnosis not present

## 2019-05-25 DIAGNOSIS — M545 Low back pain: Secondary | ICD-10-CM | POA: Diagnosis not present

## 2019-06-08 DIAGNOSIS — M545 Low back pain: Secondary | ICD-10-CM | POA: Diagnosis not present

## 2019-07-06 DIAGNOSIS — D485 Neoplasm of uncertain behavior of skin: Secondary | ICD-10-CM | POA: Diagnosis not present

## 2019-07-06 DIAGNOSIS — Z1283 Encounter for screening for malignant neoplasm of skin: Secondary | ICD-10-CM | POA: Diagnosis not present

## 2019-07-06 DIAGNOSIS — E785 Hyperlipidemia, unspecified: Secondary | ICD-10-CM | POA: Diagnosis not present

## 2019-07-06 DIAGNOSIS — J302 Other seasonal allergic rhinitis: Secondary | ICD-10-CM | POA: Diagnosis not present

## 2019-07-09 DIAGNOSIS — M25552 Pain in left hip: Secondary | ICD-10-CM | POA: Diagnosis not present

## 2019-07-09 DIAGNOSIS — M25551 Pain in right hip: Secondary | ICD-10-CM | POA: Diagnosis not present

## 2019-07-09 DIAGNOSIS — M545 Low back pain: Secondary | ICD-10-CM | POA: Diagnosis not present

## 2019-07-09 DIAGNOSIS — R293 Abnormal posture: Secondary | ICD-10-CM | POA: Diagnosis not present

## 2019-07-12 DIAGNOSIS — R293 Abnormal posture: Secondary | ICD-10-CM | POA: Diagnosis not present

## 2019-07-12 DIAGNOSIS — M25551 Pain in right hip: Secondary | ICD-10-CM | POA: Diagnosis not present

## 2019-07-12 DIAGNOSIS — M545 Low back pain: Secondary | ICD-10-CM | POA: Diagnosis not present

## 2019-07-12 DIAGNOSIS — M25552 Pain in left hip: Secondary | ICD-10-CM | POA: Diagnosis not present

## 2019-07-13 DIAGNOSIS — H2513 Age-related nuclear cataract, bilateral: Secondary | ICD-10-CM | POA: Diagnosis not present

## 2019-07-13 DIAGNOSIS — H5213 Myopia, bilateral: Secondary | ICD-10-CM | POA: Diagnosis not present

## 2019-07-14 DIAGNOSIS — M545 Low back pain: Secondary | ICD-10-CM | POA: Diagnosis not present

## 2019-07-14 DIAGNOSIS — M25551 Pain in right hip: Secondary | ICD-10-CM | POA: Diagnosis not present

## 2019-07-14 DIAGNOSIS — R293 Abnormal posture: Secondary | ICD-10-CM | POA: Diagnosis not present

## 2019-07-14 DIAGNOSIS — M25552 Pain in left hip: Secondary | ICD-10-CM | POA: Diagnosis not present

## 2019-07-16 DIAGNOSIS — R293 Abnormal posture: Secondary | ICD-10-CM | POA: Diagnosis not present

## 2019-07-16 DIAGNOSIS — M25552 Pain in left hip: Secondary | ICD-10-CM | POA: Diagnosis not present

## 2019-07-16 DIAGNOSIS — M545 Low back pain: Secondary | ICD-10-CM | POA: Diagnosis not present

## 2019-07-16 DIAGNOSIS — M25551 Pain in right hip: Secondary | ICD-10-CM | POA: Diagnosis not present

## 2019-07-19 DIAGNOSIS — M25551 Pain in right hip: Secondary | ICD-10-CM | POA: Diagnosis not present

## 2019-07-19 DIAGNOSIS — M545 Low back pain: Secondary | ICD-10-CM | POA: Diagnosis not present

## 2019-07-19 DIAGNOSIS — M25552 Pain in left hip: Secondary | ICD-10-CM | POA: Diagnosis not present

## 2019-07-19 DIAGNOSIS — R293 Abnormal posture: Secondary | ICD-10-CM | POA: Diagnosis not present

## 2019-07-21 DIAGNOSIS — M25552 Pain in left hip: Secondary | ICD-10-CM | POA: Diagnosis not present

## 2019-07-21 DIAGNOSIS — M25551 Pain in right hip: Secondary | ICD-10-CM | POA: Diagnosis not present

## 2019-07-21 DIAGNOSIS — R293 Abnormal posture: Secondary | ICD-10-CM | POA: Diagnosis not present

## 2019-07-21 DIAGNOSIS — M545 Low back pain: Secondary | ICD-10-CM | POA: Diagnosis not present

## 2019-07-23 DIAGNOSIS — M25551 Pain in right hip: Secondary | ICD-10-CM | POA: Diagnosis not present

## 2019-07-23 DIAGNOSIS — M545 Low back pain: Secondary | ICD-10-CM | POA: Diagnosis not present

## 2019-07-23 DIAGNOSIS — M25552 Pain in left hip: Secondary | ICD-10-CM | POA: Diagnosis not present

## 2019-07-23 DIAGNOSIS — R293 Abnormal posture: Secondary | ICD-10-CM | POA: Diagnosis not present

## 2019-07-26 DIAGNOSIS — M25551 Pain in right hip: Secondary | ICD-10-CM | POA: Diagnosis not present

## 2019-07-26 DIAGNOSIS — M25552 Pain in left hip: Secondary | ICD-10-CM | POA: Diagnosis not present

## 2019-07-26 DIAGNOSIS — M545 Low back pain: Secondary | ICD-10-CM | POA: Diagnosis not present

## 2019-07-26 DIAGNOSIS — R293 Abnormal posture: Secondary | ICD-10-CM | POA: Diagnosis not present

## 2019-07-29 DIAGNOSIS — Z23 Encounter for immunization: Secondary | ICD-10-CM | POA: Diagnosis not present

## 2019-08-03 DIAGNOSIS — M545 Low back pain: Secondary | ICD-10-CM | POA: Diagnosis not present

## 2019-08-03 DIAGNOSIS — R293 Abnormal posture: Secondary | ICD-10-CM | POA: Diagnosis not present

## 2019-08-03 DIAGNOSIS — M25551 Pain in right hip: Secondary | ICD-10-CM | POA: Diagnosis not present

## 2019-08-03 DIAGNOSIS — M25552 Pain in left hip: Secondary | ICD-10-CM | POA: Diagnosis not present

## 2019-08-05 DIAGNOSIS — M25551 Pain in right hip: Secondary | ICD-10-CM | POA: Diagnosis not present

## 2019-08-05 DIAGNOSIS — M545 Low back pain: Secondary | ICD-10-CM | POA: Diagnosis not present

## 2019-08-05 DIAGNOSIS — R293 Abnormal posture: Secondary | ICD-10-CM | POA: Diagnosis not present

## 2019-08-05 DIAGNOSIS — M25552 Pain in left hip: Secondary | ICD-10-CM | POA: Diagnosis not present

## 2019-08-10 DIAGNOSIS — R293 Abnormal posture: Secondary | ICD-10-CM | POA: Diagnosis not present

## 2019-08-10 DIAGNOSIS — M25552 Pain in left hip: Secondary | ICD-10-CM | POA: Diagnosis not present

## 2019-08-10 DIAGNOSIS — M545 Low back pain: Secondary | ICD-10-CM | POA: Diagnosis not present

## 2019-08-10 DIAGNOSIS — M25551 Pain in right hip: Secondary | ICD-10-CM | POA: Diagnosis not present

## 2019-08-12 DIAGNOSIS — M25551 Pain in right hip: Secondary | ICD-10-CM | POA: Diagnosis not present

## 2019-08-12 DIAGNOSIS — M545 Low back pain: Secondary | ICD-10-CM | POA: Diagnosis not present

## 2019-08-12 DIAGNOSIS — M25552 Pain in left hip: Secondary | ICD-10-CM | POA: Diagnosis not present

## 2019-08-12 DIAGNOSIS — R293 Abnormal posture: Secondary | ICD-10-CM | POA: Diagnosis not present

## 2019-08-13 DIAGNOSIS — E785 Hyperlipidemia, unspecified: Secondary | ICD-10-CM | POA: Diagnosis not present

## 2019-08-13 DIAGNOSIS — Z1389 Encounter for screening for other disorder: Secondary | ICD-10-CM | POA: Diagnosis not present

## 2019-08-13 DIAGNOSIS — Z131 Encounter for screening for diabetes mellitus: Secondary | ICD-10-CM | POA: Diagnosis not present

## 2019-08-13 DIAGNOSIS — I2584 Coronary atherosclerosis due to calcified coronary lesion: Secondary | ICD-10-CM | POA: Diagnosis not present

## 2019-08-13 DIAGNOSIS — I2510 Atherosclerotic heart disease of native coronary artery without angina pectoris: Secondary | ICD-10-CM | POA: Diagnosis not present

## 2019-08-13 DIAGNOSIS — Z1159 Encounter for screening for other viral diseases: Secondary | ICD-10-CM | POA: Diagnosis not present

## 2019-08-13 DIAGNOSIS — N400 Enlarged prostate without lower urinary tract symptoms: Secondary | ICD-10-CM | POA: Diagnosis not present

## 2019-08-13 DIAGNOSIS — Z125 Encounter for screening for malignant neoplasm of prostate: Secondary | ICD-10-CM | POA: Diagnosis not present

## 2019-08-13 DIAGNOSIS — Z0000 Encounter for general adult medical examination without abnormal findings: Secondary | ICD-10-CM | POA: Diagnosis not present

## 2019-08-13 DIAGNOSIS — R001 Bradycardia, unspecified: Secondary | ICD-10-CM | POA: Diagnosis not present

## 2019-08-13 DIAGNOSIS — E538 Deficiency of other specified B group vitamins: Secondary | ICD-10-CM | POA: Diagnosis not present

## 2019-08-13 DIAGNOSIS — E559 Vitamin D deficiency, unspecified: Secondary | ICD-10-CM | POA: Diagnosis not present

## 2019-08-13 DIAGNOSIS — E790 Hyperuricemia without signs of inflammatory arthritis and tophaceous disease: Secondary | ICD-10-CM | POA: Diagnosis not present

## 2019-08-17 DIAGNOSIS — L821 Other seborrheic keratosis: Secondary | ICD-10-CM | POA: Diagnosis not present

## 2019-08-17 DIAGNOSIS — L815 Leukoderma, not elsewhere classified: Secondary | ICD-10-CM | POA: Diagnosis not present

## 2019-08-17 DIAGNOSIS — D485 Neoplasm of uncertain behavior of skin: Secondary | ICD-10-CM | POA: Diagnosis not present

## 2019-08-17 DIAGNOSIS — L820 Inflamed seborrheic keratosis: Secondary | ICD-10-CM | POA: Diagnosis not present

## 2019-08-17 DIAGNOSIS — D225 Melanocytic nevi of trunk: Secondary | ICD-10-CM | POA: Diagnosis not present

## 2019-08-17 DIAGNOSIS — L578 Other skin changes due to chronic exposure to nonionizing radiation: Secondary | ICD-10-CM | POA: Diagnosis not present

## 2019-08-24 DIAGNOSIS — M545 Low back pain: Secondary | ICD-10-CM | POA: Diagnosis not present

## 2019-08-24 DIAGNOSIS — M25552 Pain in left hip: Secondary | ICD-10-CM | POA: Diagnosis not present

## 2019-08-24 DIAGNOSIS — M25551 Pain in right hip: Secondary | ICD-10-CM | POA: Diagnosis not present

## 2019-08-24 DIAGNOSIS — R293 Abnormal posture: Secondary | ICD-10-CM | POA: Diagnosis not present

## 2019-08-26 DIAGNOSIS — M25551 Pain in right hip: Secondary | ICD-10-CM | POA: Diagnosis not present

## 2019-08-26 DIAGNOSIS — M545 Low back pain: Secondary | ICD-10-CM | POA: Diagnosis not present

## 2019-08-26 DIAGNOSIS — M25552 Pain in left hip: Secondary | ICD-10-CM | POA: Diagnosis not present

## 2019-08-26 DIAGNOSIS — R293 Abnormal posture: Secondary | ICD-10-CM | POA: Diagnosis not present

## 2019-09-02 DIAGNOSIS — R293 Abnormal posture: Secondary | ICD-10-CM | POA: Diagnosis not present

## 2019-09-02 DIAGNOSIS — M25552 Pain in left hip: Secondary | ICD-10-CM | POA: Diagnosis not present

## 2019-09-02 DIAGNOSIS — M25551 Pain in right hip: Secondary | ICD-10-CM | POA: Diagnosis not present

## 2019-09-02 DIAGNOSIS — M545 Low back pain: Secondary | ICD-10-CM | POA: Diagnosis not present

## 2019-09-03 DIAGNOSIS — I2510 Atherosclerotic heart disease of native coronary artery without angina pectoris: Secondary | ICD-10-CM | POA: Diagnosis not present

## 2019-09-09 DIAGNOSIS — R293 Abnormal posture: Secondary | ICD-10-CM | POA: Diagnosis not present

## 2019-09-09 DIAGNOSIS — M25551 Pain in right hip: Secondary | ICD-10-CM | POA: Diagnosis not present

## 2019-09-09 DIAGNOSIS — M25552 Pain in left hip: Secondary | ICD-10-CM | POA: Diagnosis not present

## 2019-09-09 DIAGNOSIS — M545 Low back pain: Secondary | ICD-10-CM | POA: Diagnosis not present

## 2021-02-09 IMAGING — CT CT ABD-PELV W/ CM
2 of 5 series · 16 of 46 positions shown, 18 images · IV contrast (OMNIPAQUE 300)
Comparison: None.

CLINICAL DATA: Mid abdominal, epigastric abdominal pain and
bloating, increased gas

EXAM:
CT ABDOMEN AND PELVIS WITH CONTRAST
TECHNIQUE: Multidetector CT imaging of the abdomen and pelvis was performed
using the standard protocol following bolus administration of
intravenous contrast.
CONTRAST:  100mL OMNIPAQUE IOHEXOL 300 MG/ML SOLN, additional oral
enteric contrast

[Series 2: abd/pel w · axial · 0.71mm/px · z∈[-486,-96]mm · 13 of 88 slices shown, 15 images]
[im 5/88  soft-tissue]
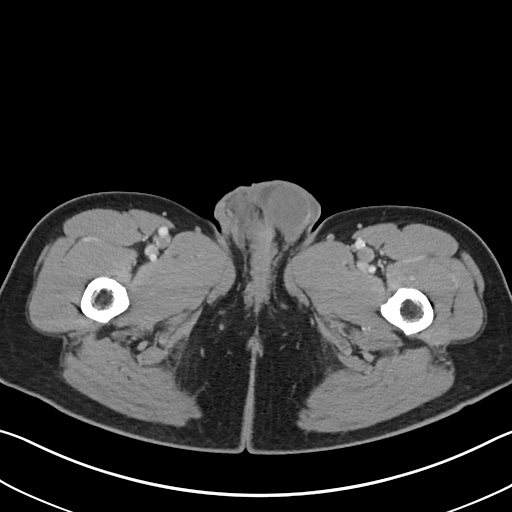
[im 5/88  bone]
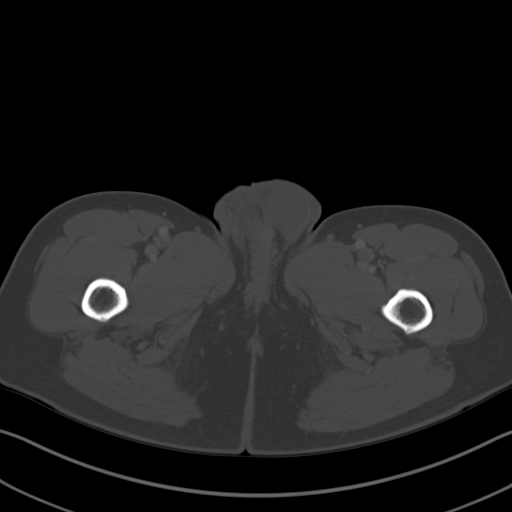
[im 14/88  soft-tissue]
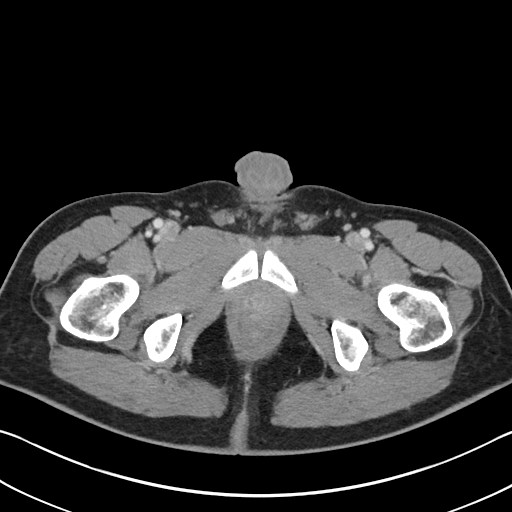
[im 18/88  soft-tissue]
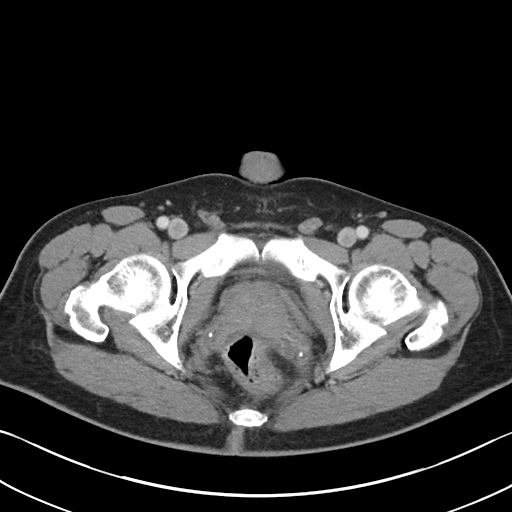
[im 27/88  soft-tissue]
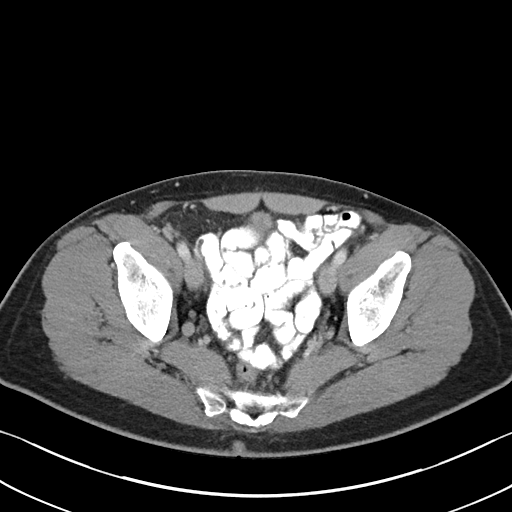
[im 31/88  soft-tissue]
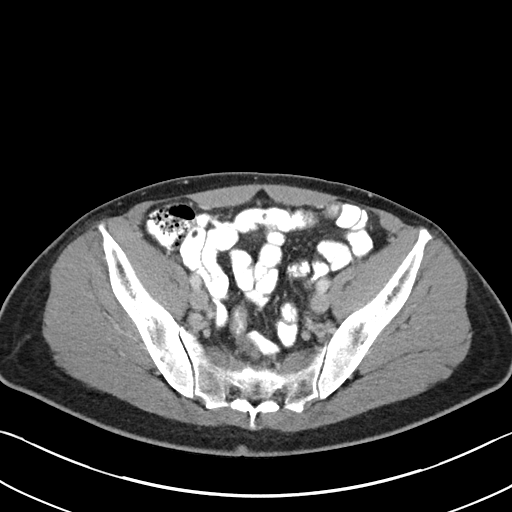
[im 40/88  soft-tissue]
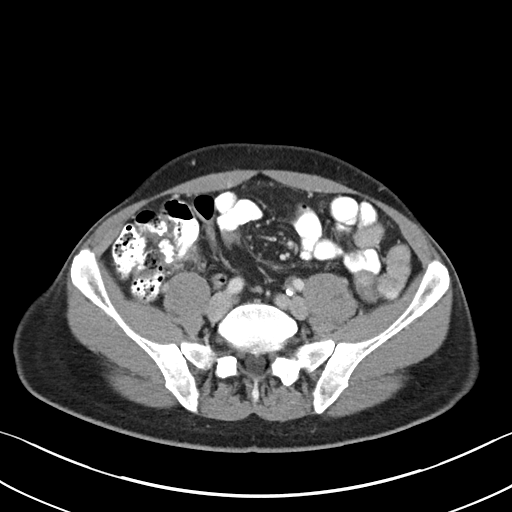
[im 44/88  soft-tissue]
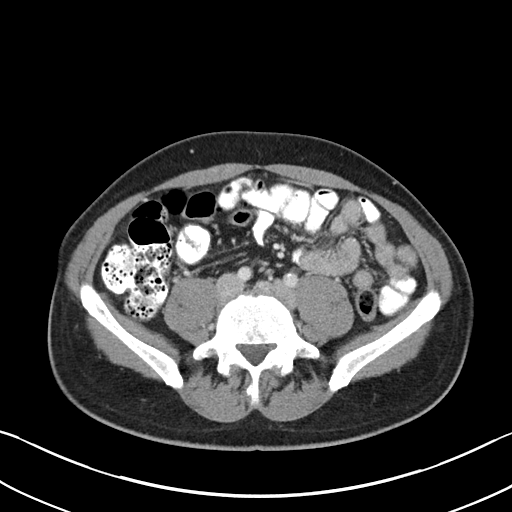
[im 48/88  soft-tissue]
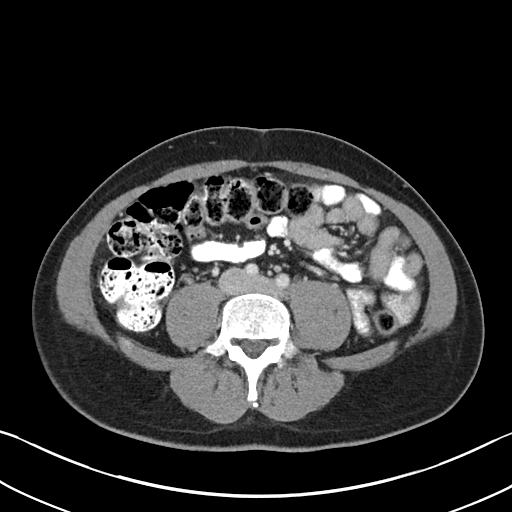
[im 57/88  soft-tissue]
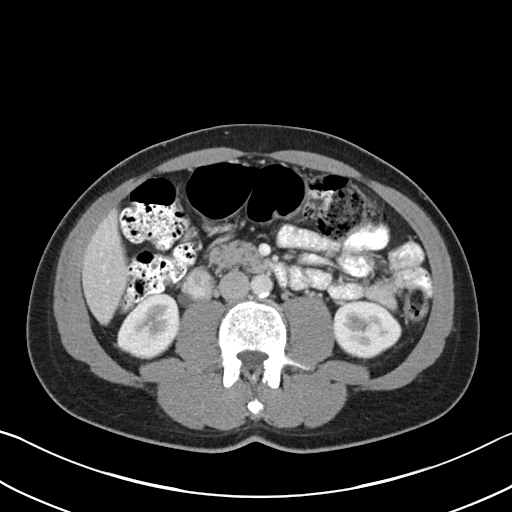
[im 57/88  bone]
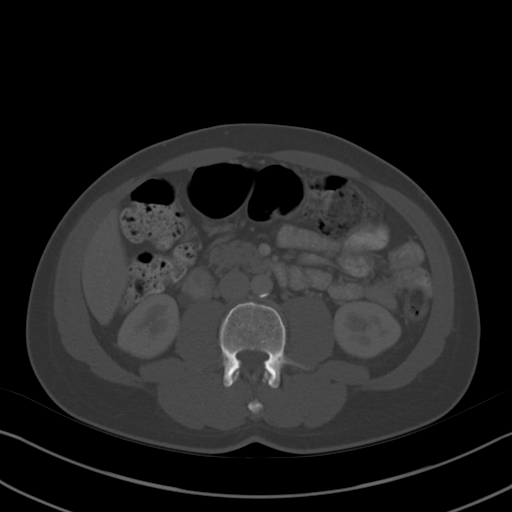
[im 61/88  soft-tissue]
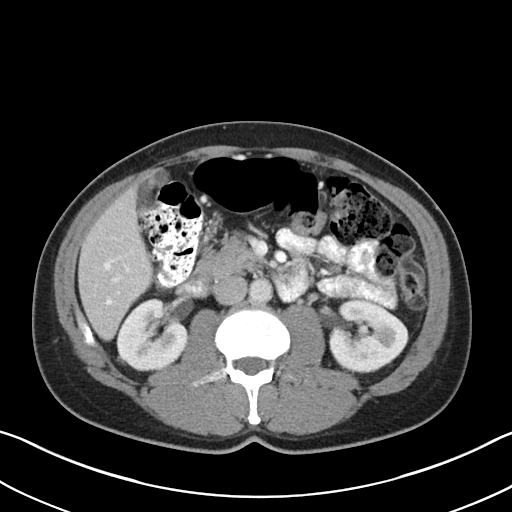
[im 70/88  soft-tissue]
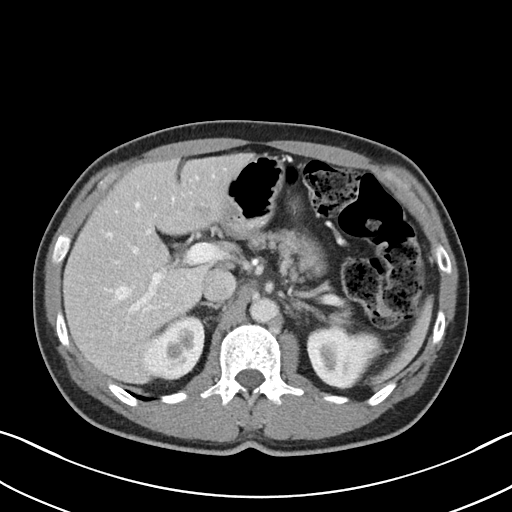
[im 74/88  soft-tissue]
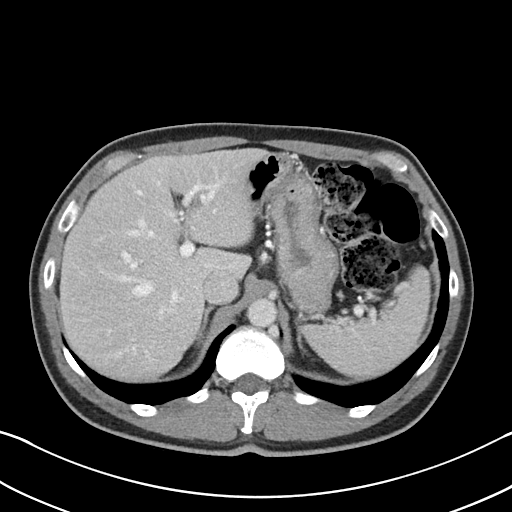
[im 83/88  soft-tissue]
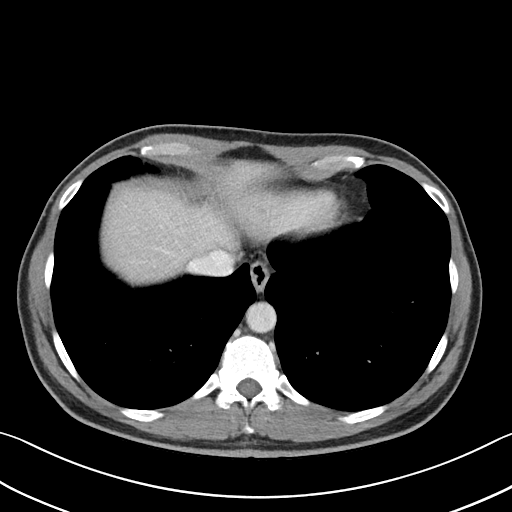

[Series 5: abd/pel w st · coronal · 0.71mm/px · 3 of 72 slices shown]
[im 24/72  soft-tissue]
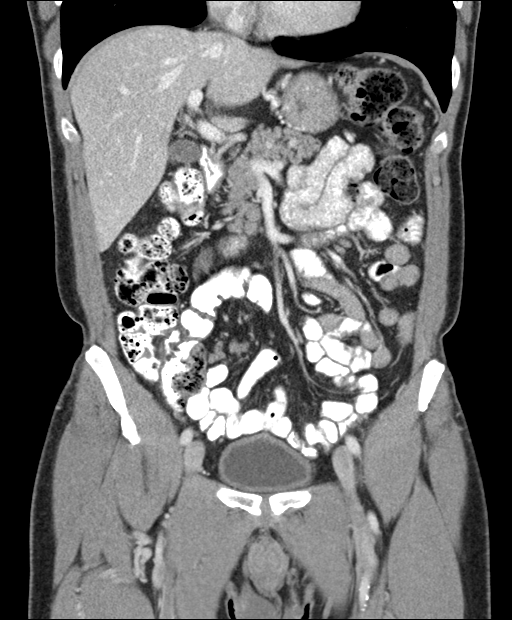
[im 32/72  soft-tissue]
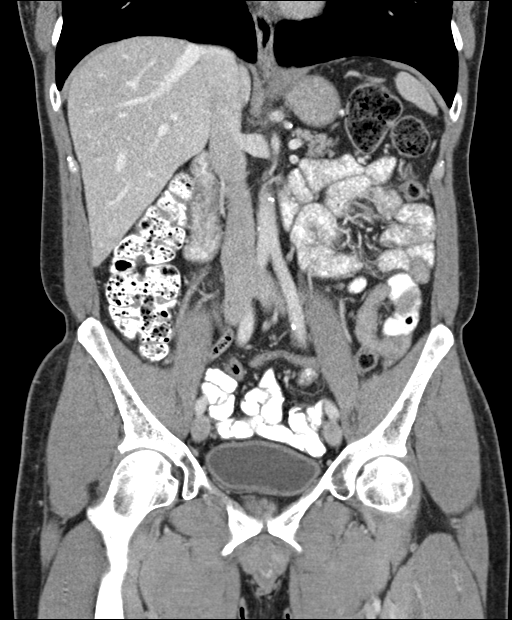
[im 40/72  soft-tissue]
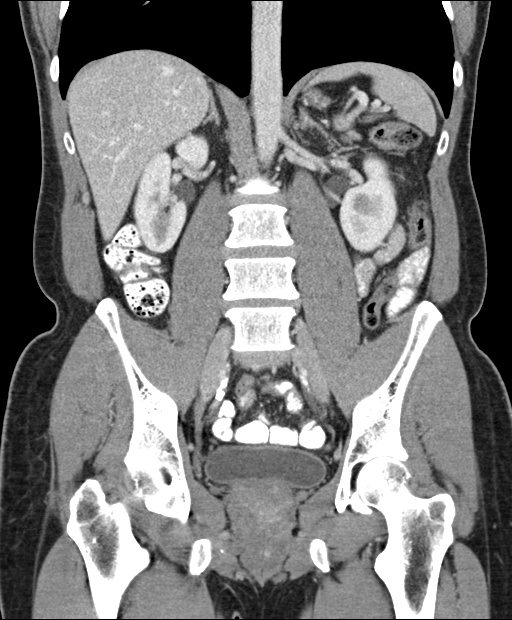

[16 of 46 positions shown; findings below may reference images not displayed]

FINDINGS: Lower chest: No acute abnormality. 2 mm pulmonary nodule of the
right lung base (series 3, image 6).

Hepatobiliary: No solid liver abnormality is seen. No gallstones,
gallbladder wall thickening, or biliary dilatation.

Pancreas: Unremarkable. No pancreatic ductal dilatation or
surrounding inflammatory changes.

Spleen: Normal in size without significant abnormality.

Adrenals/Urinary Tract: Adrenal glands are unremarkable. Kidneys are
normal, without renal calculi, solid lesion, or hydronephrosis.
Bladder is unremarkable.

Stomach/Bowel: Stomach is within normal limits. Appendix appears
normal. No evidence of bowel wall thickening, distention, or
inflammatory changes. Large burden of stool in the colon.

Vascular/Lymphatic: Aortic atherosclerosis. No enlarged abdominal or
pelvic lymph nodes.

Reproductive: Mild prostatomegaly.

Other: No abdominal wall hernia or abnormality. No abdominopelvic
ascites.

Musculoskeletal: No acute or significant osseous findings.
IMPRESSION: 1. No acute CT findings of the abdomen or pelvis to explain pain or
bloating.

2.  Large burden of stool in the colon.

3.  Mild prostatomegaly.

4.  Aortic atherosclerosis.

5. Incidental 2 mm pulmonary nodule of the right lung base (series
3, image 6). No follow-up needed if patient is low-risk.
Non-contrast chest CT can be considered in 12 months if patient is
high-risk. This recommendation follows the consensus statement:
Guidelines for Management of Incidental Pulmonary Nodules Detected

## 2021-06-07 ENCOUNTER — Encounter: Payer: Self-pay | Admitting: Gastroenterology
